# Patient Record
Sex: Female | Born: 1975
Health system: Southern US, Community
[De-identification: ages and names within clinical notes are randomized; demographics above are authoritative.]

## PROBLEM LIST (undated history)

## (undated) DIAGNOSIS — N83209 Unspecified ovarian cyst, unspecified side: Secondary | ICD-10-CM

## (undated) DIAGNOSIS — N809 Endometriosis, unspecified: Secondary | ICD-10-CM

## (undated) HISTORY — DX: Endometriosis, unspecified: N80.9

## (undated) HISTORY — PX: ABDOMINAL HYSTERECTOMY: SHX81

## (undated) HISTORY — PX: APPENDECTOMY: SHX54

## (undated) HISTORY — DX: Unspecified ovarian cyst, unspecified side: N83.209

---

## 2008-03-14 ENCOUNTER — Inpatient Hospital Stay (HOSPITAL_COMMUNITY): Admission: AD | Admit: 2008-03-14 | Discharge: 2008-03-18 | Payer: Self-pay | Admitting: Obstetrics and Gynecology

## 2008-03-14 ENCOUNTER — Encounter (INDEPENDENT_AMBULATORY_CARE_PROVIDER_SITE_OTHER): Payer: Self-pay | Admitting: Obstetrics and Gynecology

## 2008-04-04 ENCOUNTER — Ambulatory Visit (HOSPITAL_COMMUNITY): Admission: RE | Admit: 2008-04-04 | Discharge: 2008-04-04 | Payer: Self-pay | Admitting: Obstetrics and Gynecology

## 2009-06-28 IMAGING — CT CT ABDOMEN W/ CM
2 of 6 series · 17 of 46 positions shown, 19 images · IV contrast (agent unspecified)
Comparison: None

CT ABDOMEN

CLINICAL DATA: Right lower quadrant abdominal pain.  History of a
C-section 3 weeks ago.

CT ABDOMEN AND PELVIS WITH CONTRAST
TECHNIQUE: Multidetector CT imaging of the abdomen and pelvis was
performed using the standard protocol following bolus
administration of intravenous contrast.
Contrast: 100 ml of Qmnipaque-LUU

[Series 2: abd_pel 5.0 b40s · axial · 0.92mm/px · z∈[-486,-96]mm · 14 of 91 slices shown, 16 images]
[im 7/91  soft-tissue]
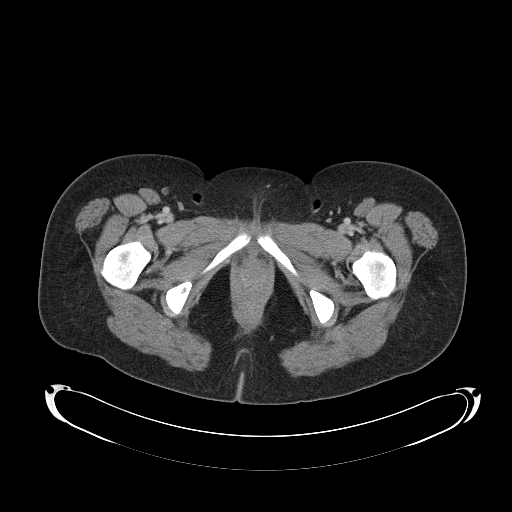
[im 7/91  bone]
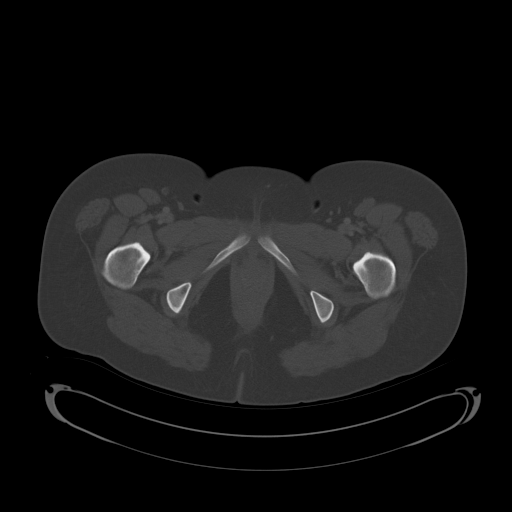
[im 13/91  soft-tissue]
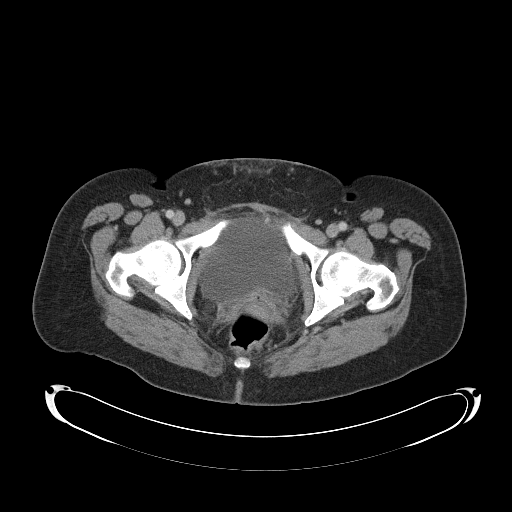
[im 19/91  soft-tissue]
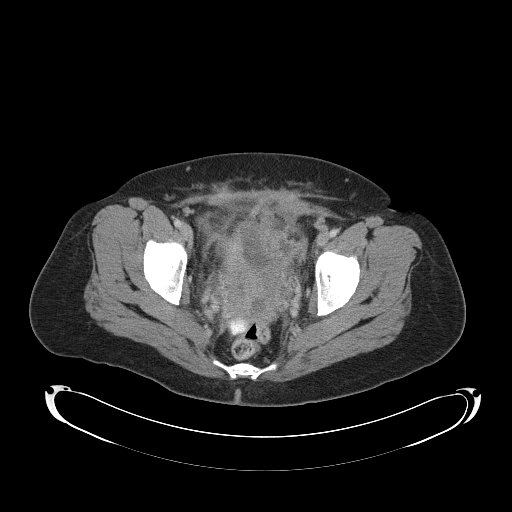
[im 25/91  soft-tissue]
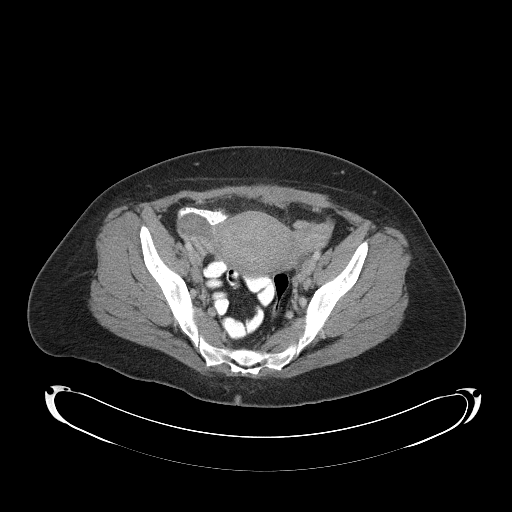
[im 31/91  soft-tissue]
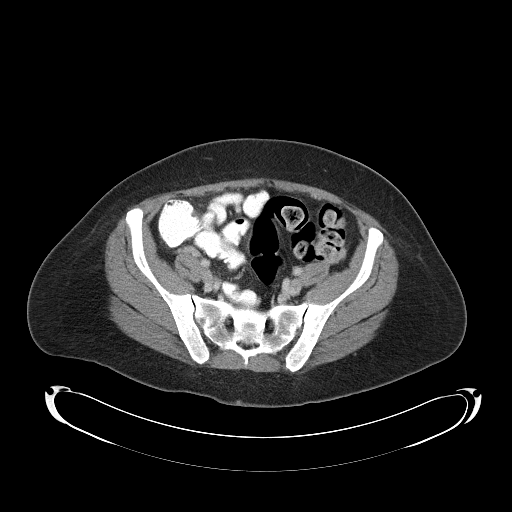
[im 37/91  soft-tissue]
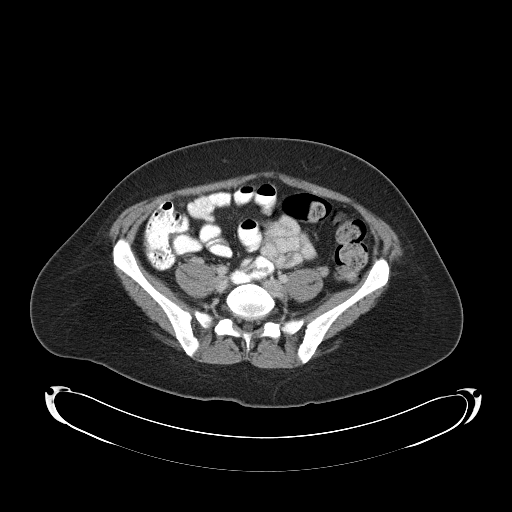
[im 43/91  soft-tissue]
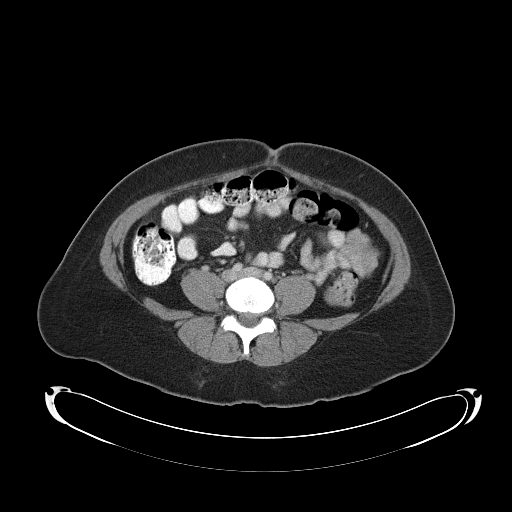
[im 49/91  soft-tissue]
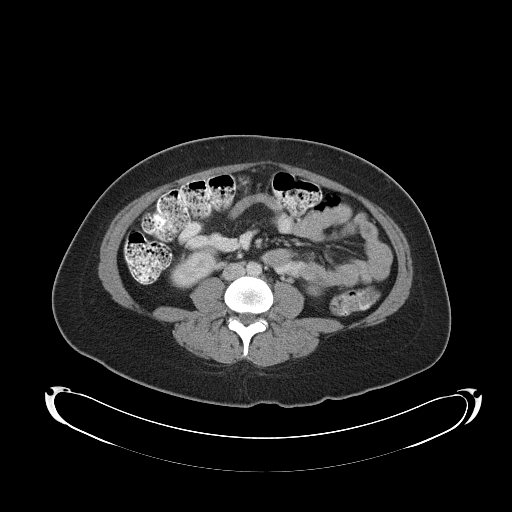
[im 55/91  soft-tissue]
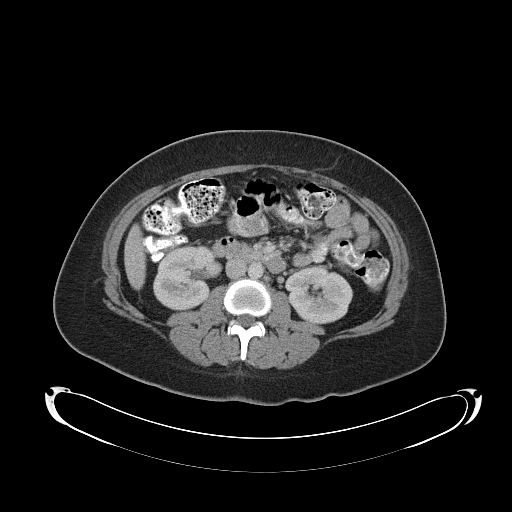
[im 55/91  bone]
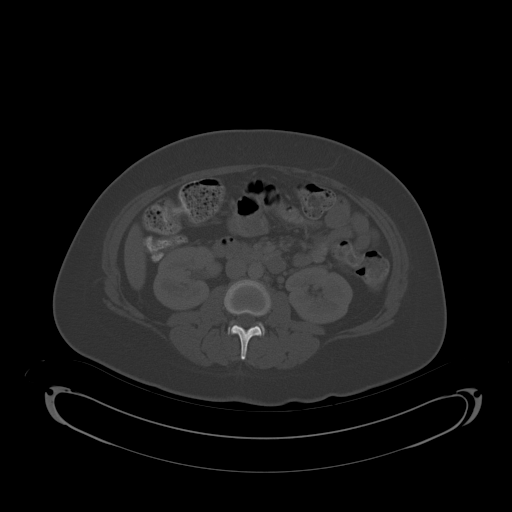
[im 61/91  soft-tissue]
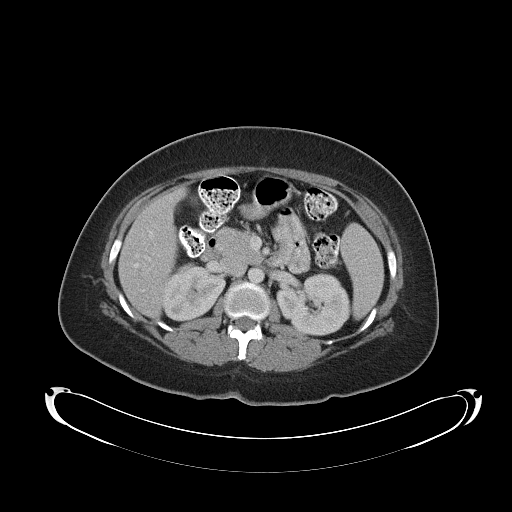
[im 67/91  soft-tissue]
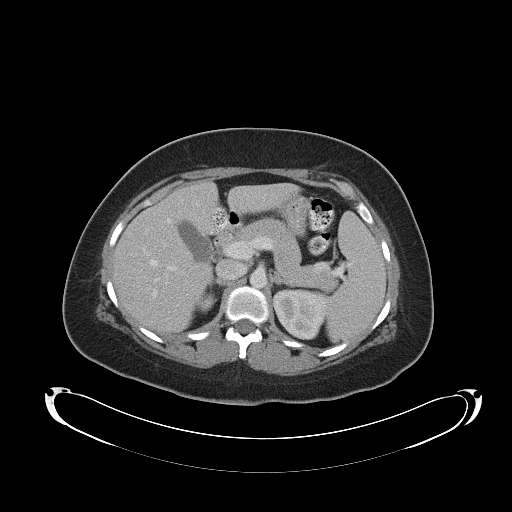
[im 73/91  soft-tissue]
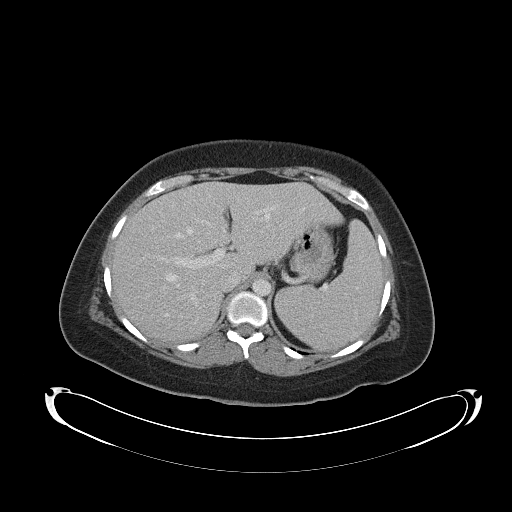
[im 79/91  soft-tissue]
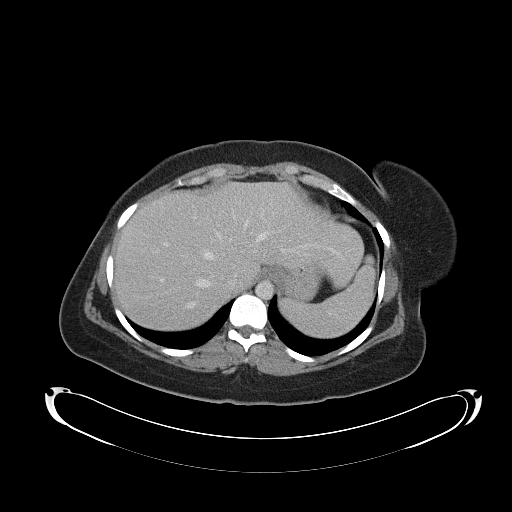
[im 85/91  soft-tissue]
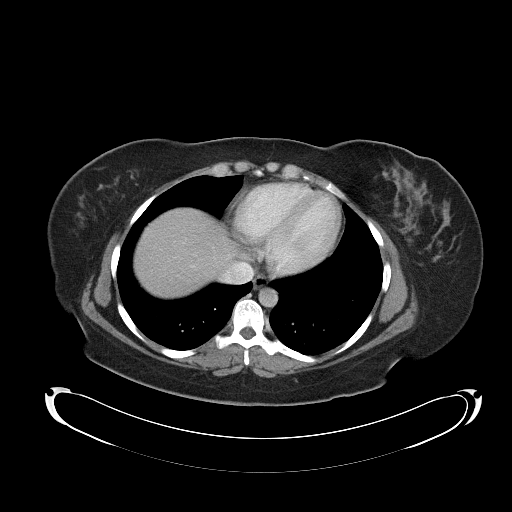

[Series 602: <mpr thick range> · coronal · 0.92mm/px · 3 of 89 slices shown]
[im 30/89  soft-tissue]
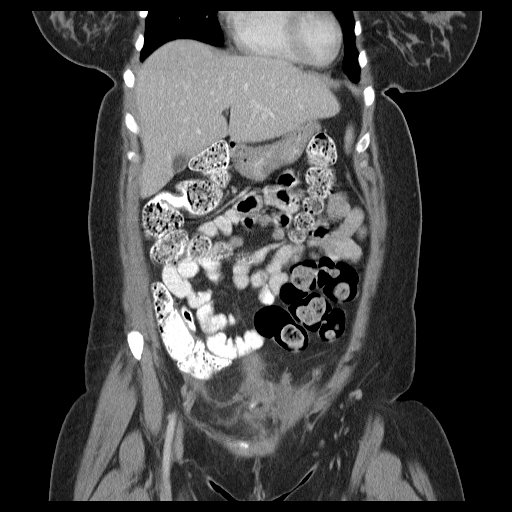
[im 40/89  soft-tissue]
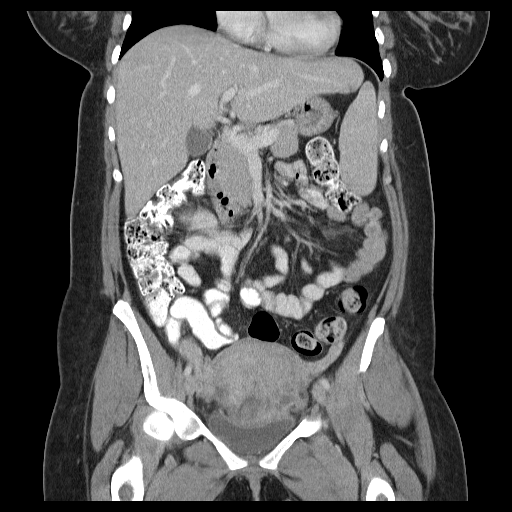
[im 49/89  soft-tissue]
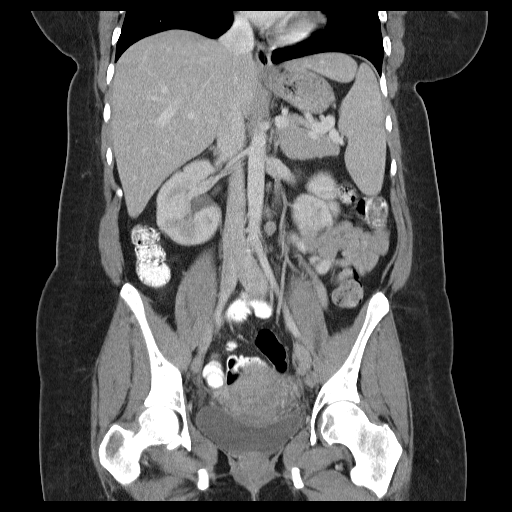

[17 of 46 positions shown; findings below may reference images not displayed]

FINDINGS: The lung bases are clear.  The liver, spleen, pancreas,
adrenal glands and kidneys are unremarkable.

The stomach, duodenum, small bowel and colon demonstrate no
significant findings.  There is moderate stool throughout the colon
which could suggest constipation.

The aorta is normal in caliber.  No dissection.  The major branch
vessels are normal.  No mesenteric or retroperitoneal masses or
adenopathy.

No significant bony findings.
IMPRESSION: 1.  Unremarkable CT examination of the abdomen.  No acute abdominal
findings, mass lesions or adenopathy.
2.  Moderate stool throughout the colon.

CT PELVIS
FINDINGS: The rectum, sigmoid colon visualized small bowel loops
are unremarkable.  The appendix is not visualized for certain but I
do not see a definite findings for appendicitis.  Both ovaries
appear normal.  The uterus is abnormal in appearance.  The anterior
uterine myometrium appears separated by fluid.  It may be a
dehiscent C-section wound.  Intervening hematoma would be possible.
There is also mild fluid and inflammatory change in the pelvis
which may be resolving postoperative hematoma.  The urinary bladder
appears normal.  Multiple Nabothian cysts are noted near the
cervix.
IMPRESSION: 1.  Postoperative changes in the pelvis from a recent C-section.
Findings suspicious for a dehiscent wound involving the anterior
uterus.
2.  The ovaries appear normal.

3.  Nabothian cysts noted at the cervix.

## 2010-07-25 ENCOUNTER — Other Ambulatory Visit (HOSPITAL_COMMUNITY): Payer: Self-pay | Admitting: Gastroenterology

## 2010-07-25 ENCOUNTER — Other Ambulatory Visit: Payer: Self-pay | Admitting: Gastroenterology

## 2010-07-25 DIAGNOSIS — R110 Nausea: Secondary | ICD-10-CM

## 2010-08-01 ENCOUNTER — Encounter (HOSPITAL_COMMUNITY)
Admission: RE | Admit: 2010-08-01 | Discharge: 2010-08-01 | Disposition: A | Discharge status: Home / Self Care | Payer: 59 | Source: Ambulatory Visit | Attending: Gastroenterology | Admitting: Gastroenterology

## 2010-08-01 DIAGNOSIS — R110 Nausea: Secondary | ICD-10-CM

## 2010-08-01 MED ORDER — TECHNETIUM TC 99M MEBROFENIN IV KIT
5.4000 | PACK | Freq: Once | INTRAVENOUS | Status: AC | PRN
  Administered 2010-08-01: 5 via INTRAVENOUS

## 2010-08-01 MED ORDER — SINCALIDE 5 MCG IJ SOLR: 0.0200 ug/kg | Freq: Once | INTRAMUSCULAR | Status: DC

## 2010-08-05 ENCOUNTER — Ambulatory Visit
Admission: RE | Admit: 2010-08-05 | Discharge: 2010-08-05 | Disposition: A | Discharge status: Home / Self Care | Payer: 59 | Source: Ambulatory Visit | Attending: Gastroenterology | Admitting: Gastroenterology

## 2010-08-05 DIAGNOSIS — R110 Nausea: Secondary | ICD-10-CM

## 2010-09-16 NOTE — H&P (Signed)
NAMESRI, CLEGG                 ACCOUNT NO.:  [id number]   MEDICAL RECORD NO.:  [id number]          PATIENT TYPE:  INP   LOCATION:  NA                            [facility]  [facility]   PHYSICIAN:  Sherron Monday, MD        DATE OF BIRTH:  [birth year]   DATE OF ADMISSION:  03/14/2008  DATE OF DISCHARGE:                              HISTORY & PHYSICAL   PROCEDURE PLANNED:  Repeat low transverse cesarean section, bilateral  tubal ligation.   HISTORY OF PRESENT ILLNESS:  A 35 year old, G4, P2-0-1-2, at 33 plus  weeks for repeat low transverse cesarean section, as well as bilateral  tubal ligation.  I discussed with the patient the risks, benefits and  alternatives of low transverse cesarean section and bilateral tubal  ligation including, but not limited to, bleeding, infection, damage to  surrounding organs, as well as risk of failure with a bilateral tubal  ligation.  She voices understanding to all this and wishes to proceed.  She will be scheduled for a cesarean section on the morning of March 14, 2008.   PAST MEDICAL HISTORY:  Migraines.  Otherwise, not significant.   SURGICAL HISTORY:  1. Two cesarean sections.  2. Wisdom tooth extraction.   PAST GYNECOLOGIC HISTORY:  1. G1 was a 36-week cesarean section with abruption and bleeding of a      6 pound, 7 ounce female infant.  2. G2 was a repeat low transverse cesarean section for frank breech at      39 weeks, a 6 pound, 6 ounce child.  3. G3 was a miscarriage.  4. G4 is the present pregnancy with uncomplicated prenatal care, with      a late transfer to care at 30 weeks to [place] from previous care in      [place], [place].  5. She has a history of an abnormal Pap with a distant colposcopy;      normal since.  No history of any sexually transmitted diseases.  6. She had a hemorrhage after her delivery in 2003 without a      transfusion.  7. She has had pregnancy-induced hypertension with her first      pregnancy, as  well.   MEDICATIONS:  Prenatal vitamins.   ALLERGIES:  LATEX.   SOCIAL HISTORY:  The patient denies alcohol, tobacco or drug use.  She  is married.   FAMILY HISTORY:  Significant for heart disease in maternal grandfather.  Hypertension in mother and father.  Father with prostate cancer.  Paternal grandfather with prostate cancer.  Sister with depression.   PHYSICAL EXAMINATION:  VITAL SIGNS:  She is afebrile with stable vital  signs.  GENERAL:  No apparent distress.  CARDIOVASCULAR:  Regular rate and rhythm.  LUNGS:  Clear to auscultation bilaterally.  ABDOMEN:  Soft.  Fundus is nontender.  EXTremities: symmetric, nontender.  Fetal heart tones in the office were 120s, and her cervical exam was 1  cm dilated 50% effaced and -3 station.   This is a 35 year old, G4, P2-0-1-2, at 4 plus weeks for repeat low  transverse cesarean section.  Will proceed with this on the morning of  March 14, 2008.  We discussed risks, benefits and alternatives with  her and her husband, and they wish to proceed with this, as well as a  bilateral tubal ligation.  She voiced understanding of all this and  wishes to proceed.      Sherron Monday, MD  Electronically Signed     JB/MEDQ  D:  03/13/2008  T:  03/13/2008  Job:  [id number]

## 2010-09-16 NOTE — Discharge Summary (Signed)
NAMECALIN, Bailey [name]                 ACCOUNT NO.:  [id number]   MEDICAL RECORD NO.:  [id number]          PATIENT TYPE:  INP   LOCATION:  9139                          [facility]ILITY:  [facility]   PHYSICIAN:  Zenaida Niece, M.D.DATE OF BIRTH:  Sep 05, 1975   DATE OF ADMISSION:  03/14/2008  DATE OF DISCHARGE:  03/18/2008                               DISCHARGE SUMMARY   ADMISSION DIAGNOSES:  1. Intrauterine pregnancy at 39 weeks.  2. Previous cesarean section.  3. Desires surgical sterility.   DISCHARGE DIAGNOSES:  1. Intrauterine pregnancy at 39 weeks.  2. Previous cesarean section.  3. Postpartum anemia.  4. Desires surgical sterility.   PROCEDURE:  Repeat low transverse cesarean section and transfusion of 3  units of packed red blood cells.   HISTORY AND PHYSICAL:  Please see chart for full history and physical.  Briefly, this is a 35 year old gravida 4, para 2-0-1-2 with an EGA of [redacted]  weeks who presents for repeat low transverse cesarean section and tubal  ligation.  Prenatal care has been essentially uncomplicated.   PAST OB HISTORY:  Two cesarean sections and one miscarriage.   Physical exam is significant for a gravid abdomen.  Cervix is 1 cm  dilated.   HOSPITAL COURSE:  The patient was admitted on the day of surgery and  underwent repeat low transverse cesarean section by Dr. Ellyn Hack.  She  delivered a viable female infant with Apgars of 9 and 9, weight 6 pounds  and 13 ounces.  There were noted to be a extensive abdominal adhesions.  Dr. Ellyn Hack writes an estimated blood loss of 800 mL.  Due to the  adhesions, tubal ligation was unable to be performed.  On the evening of  surgery, Dr. Ambrose Mantle saw the patient and noted that her pulse was 130-160  with decreased urine output.  Blood pressure was stable.  Uterus was  below the umbilicus and abdomen was soft.  He did a pelvic exam and  evacuated approximately 150 mL of blood clot from the uterus.  He writes  approximately 400 mL of  total blood including clots and the uterus felt  firm.  Hemoglobin preop was 11.4.  Dr. Ambrose Mantle had a hemoglobin drawn and  it was 6.9.  She was given IV fluids.  She was given Methergine and  Hemabate for apparent uterine atony.  Hemoglobin on the morning of  March 15, 2008, was down to 5.3 and repeat that afternoon was 5.0.  At this point, the patient declined transfusion.  On the morning of  March 16, 2008, she remained hemodynamically stable, but hemoglobin  was down to 4.7.  Risks, benefits, and alternatives were discussed by  Dr. Ellyn Hack and she agreed to the transfusion of 3 units of packed red  blood cells.  On March 16, 2008, she received 3 units of packed red  blood cells.  She remained afebrile and had no other problems.  Hemoglobin on the morning of postoperative #3 after the transfusion was  7.8.  She was stable.  She was kept for further observation for any  significant bleeding.  On the morning of March 18, 2008, which was  postoperative #4, she remained afebrile without problems.  Hemoglobin  was stable at 8.1.  At this point, she was felt to be stable enough for  discharge home.  Her incision was healing well with a subcuticular  suture.   DISCHARGE INSTRUCTIONS:  Regular diet, pelvic rest, and follow up in 2  weeks.  Medications are over-the-counter Tylenol as needed and she was  given our discharge pamphlet.      Zenaida Niece, M.D.  Electronically Signed     TDM/MEDQ  D:  03/18/2008  T:  03/18/2008  Job:  [id number]

## 2010-09-16 NOTE — Op Note (Signed)
Bailey [name], Bailey [name]                 ACCOUNT NO.:  [id number]   MEDICAL RECORD NO.:  [id number]          PATIENT TYPE:  INP   LOCATION:  9139                          [facility]ILITY[facility]   PHYSICIAN:  Sherron Monday, MD        DATE OF BIRTH:  [birth year]   DATE OF PROCEDURE:  03/14/2008  DATE OF DISCHARGE:                               OPERATIVE REPORT   PREOPERATIVE DIAGNOSES:  1. Intrauterine pregnancy at term.  2. History of low transverse cesarean section x2.  3. Undesired fertility.   POSTOPERATIVE DIAGNOSES:  1. Intrauterine pregnancy at term.  2. History of low transverse cesarean section x2.  3. Delivered.   PROCEDURES:  1. Repeat low transverse cesarean section.  2. Lysis of adhesions.   SURGEON:  Sherron Monday, MD   ASSISTANT:  Zenaida Niece, MD   ANESTHESIA:  Spinal.   FINDINGS:  Viable female infant at 10:21 a.m. with Apgars of 9 at 1-minute  and 9 at 5 minutes and a weight of 6 pounds 13 ounces as well as  extensive uterine abdominal wall adhesions, omental peritoneal  adhesions, unable to visualize tubes or ovaries secondary to the  adhesions.  However, they all felt normal per the exam.   SPECIMEN:  Placenta to Pathology.   ESTIMATED BLOOD LOSS:  800 mL.   URINE OUTPUT:  100 mL of clear urine at the end of the procedure.   IV FLUIDS:  2200 mL.   COMPLICATIONS:  Unable to perform bilateral tubal ligation secondary to  adhesions and anterior uterine wall laceration.   DISPOSITION:  Stable to PACU.   PROCEDURE:  After informed consent, it was reviewed with the patient  including risks, benefits, and alternatives of the surgical procedure.  She was transferred to the OR where spinal anesthesia was induced and  found to be adequate.  She was then placed in supine position with a  leftward tilt.  She was prepped and draped in the normal sterile  fashion.  A Pfannenstiel skin incision was made at the level of her  previous Pfannenstiel, carried through the  underlying layer of fascia  sharply.  The fascia was incised in midline.  The incision was extended  laterally with Mayo scissors.  Midline was easily identified, elevated  with snaps, and the peritoneum was entered sharply.  The incision was  extended superiorly and inferiorly with good visualization of the  bladder, and entering the peritoneum, it was noted that the anterior  abdominal wall was closely approximated, and the rectus muscles were  scarred to the anterior abdominal wall.  An inadvertent uterine  laceration was made.  The uterus was incised in the transverse fashion.  Infant was delivered from the vertex presentation.  The nose and mouth  were suctioned on the field.  Cord was clamped and cut.  Infant was  handed off to awaiting pediatric staff.  The placenta was then  expressed.  The uterus was cleared of all clots and debris.  The uterine  incision was closed in 2 layers of 0 Monocryl.  The first of which  was a  running locked and the second was an imbricating on the right aspect of  the incision.  The rectus muscle was scarred to the uterus.  This was  taken down with cautery and careful visualization.  The irrigation of  the pelvis was attempted to be performed.  Then, the rectus muscles were  reapproximated using 2-0 Vicryl.  The subfascial planes were inspected  and found to be hemostatic.  The rectus muscles were also hemostatic.  The fascia was closed with 0 Vicryl in a running fashion.  The  subcuticular adipose layer was reapproximated using 2-0 plain gut.  The  skin at the patient's request was closed with subcuticular with a 3-0  Vicryl in a subcuticular fashion.  Benzoin and Steri-Strips were  applied.  Sponge, lap, and needle counts were correct x2 at the end of  the procedure.  The patient tolerated the procedure well and transported  to the PACU in stable condition.   Secondary to being unable to visualize tubes and ovaries, I discussed  with the patient.   We will perform a postpartum Essure procedure for  tubal ligation versus using an IUD at the patient's request.  We will  discuss this further and she voiced understanding all of these.      Sherron Monday, MD  Electronically Signed     JB/MEDQ  D:  03/14/2008  T:  03/14/2008  Job:  [id number]

## 2011-02-03 LAB — CBC
HCT: 34.7 — ABNORMAL LOW
Hemoglobin: 11.4 — ABNORMAL LOW
Hemoglobin: 6.9 — CL
MCHC: 32.9
MCHC: 33
MCHC: 33.4
MCV: 84.8
MCV: 85.4
MCV: 85.4
Platelets: 105 — ABNORMAL LOW
RBC: 2.49 — ABNORMAL LOW
RBC: 4.06
RDW: 15.3
RDW: 15.8 — ABNORMAL HIGH
WBC: 6.3

## 2011-02-03 LAB — TYPE AND SCREEN: ABO/RH(D): A POS

## 2011-02-03 LAB — ABO/RH: ABO/RH(D): A POS

## 2011-02-03 LAB — HEMATOCRIT: HCT: 14.1 — ABNORMAL LOW

## 2011-02-03 LAB — HEMOGLOBIN: Hemoglobin: 5 — CL

## 2011-06-03 ENCOUNTER — Ambulatory Visit: Payer: Self-pay | Admitting: Obstetrics & Gynecology

## 2011-06-03 LAB — CBC
MCH: 29.2 pg (ref 26.0–34.0)
MCHC: 34.8 g/dL (ref 32.0–36.0)
MCV: 84 fL (ref 80–100)
Platelet: 173 10*3/uL (ref 150–440)
RBC: 4.07 10*6/uL (ref 3.80–5.20)

## 2011-06-03 LAB — PREGNANCY, URINE: Pregnancy Test, Urine: NEGATIVE m[IU]/mL

## 2011-06-09 ENCOUNTER — Observation Stay: Payer: Self-pay | Admitting: Obstetrics & Gynecology

## 2011-12-22 ENCOUNTER — Ambulatory Visit: Payer: Self-pay | Admitting: Family Medicine

## 2014-08-26 NOTE — Op Note (Signed)
PATIENT NAME:  Bailey [name], Bailey [name] MR#:  [id number] DATE OF BIRTH:  1976-02-17  DATE OF PROCEDURE:  06/09/2011  PREOPERATIVE DIAGNOSIS: Menorrhagia.  POSTOPERATIVE DIAGNOSIS: Menorrhagia.   PROCEDURE: Laparoscopic supracervical hysterectomy with bilateral salpingectomy.   SURGEON: Dierdre Searles. Paul Radin Raptis, MD   ASSISTANT: Adria Devonarrie Klett, MD   ANESTHESIA: General.  ESTIMATED BLOOD LOSS: 25 mL.   COMPLICATIONS: None.   FINDINGS: Significant adhesions of the uterus to the anterior abdominal wall.   DISPOSITION: To recovery room in stable condition.   TECHNIQUE: The patient is prepped and draped in the usual sterile fashion after adequate anesthesia is obtained in the dorsal lithotomy position. Foley catheter is inserted. Sponge stick is placed for vaginal manipulation.   Attention is then turned to the abdomen where a Veress needle is inserted through a 5 mm infraumbilical incision after Marcaine is used to anesthetize the skin. Veress needle placement is confirmed using the hanging drop technique and the abdomen is then insufflated with CO2 gas. A 5 mm trocar is then inserted under direct visualization with the laparoscope with no injuries or bleeding noted.   The patient is placed in Trendelenburg positioning and an 11 mm trocar is placed in the right lower quadrant and a 5 mm trocar is placed in the left lower quadrant both lateral to the inferior epigastric blood vessels with no injuries or bleeding noted. The adhesions are noted including some omental adhesions but mostly adhesions of the uterus to the anterior abdominal wall in the lower uterine segment area and bladder area The adhesions are carefully dissected down to this level with further dissection throughout the case as we worked our way towards the lower uterine segment and cervix. The round ligaments are carefully coagulated and cut. The fallopian tubes are grasped and excised with preservation of the main blood supply to the ovaries and the  ovaries themselves. The uterine ovarian blood vessels and ligaments are carefully coagulated and but using the Harmonic scalpel and the dissection was carried down to the level of the uterine arteries which are coagulated using the bipolar cautery device. Further dissection of the bladder in an inferior direction is performed. The bladder is retrograde filled with methylene blue solution to ensure the integrity and to identify the boundaries of the bladder. Using the Harmonic scalpel, the uterus is amputated with a portion of the cervix left behind to complete the supracervical hysterectomy component of the case. Approximately 1 to 1.5 cm length of cervix is left in utero. The endocervical canal is cauterized. The sponge stick is maneuvered to visualize the integrity of the cervix and the bladder with no injuries apparently noted. There is no apparent injury to bowel or ureter as well.   Morcellator device is placed through the right lower quadrant incision and the uterus is removed by morcellation process. The pelvic cavity is irrigated with aspiration of fluid and hemostasis is assured using electrocautery. Interceed is placed over the cervical stump to minimize future adhesion formation. The right lower quadrant incision is closed with a Vicryl suture using a fascial closure device. All trocars are removed after gas is expelled and the patient is leveled. Skin is closed with Dermabond. Foley catheter left in place and sponge stick is removed. The patient goes to the recovery room in stable condition. All sponge, instrument, and needle counts are correct.   ____________________________ [name]. Annamarie MajorPaul Jonita Hirota, MD rph:drc D: 06/09/2011 08:54:37 ET T: 06/09/2011 10:18:06 ET JOB#: [id number]  cc: Dierdre Searles. Paul Pilot Prindle, MD, <Dictator> Nadara MustardOBERT P Jere Vanburen MD  ELECTRONICALLY SIGNED 06/10/2011 7:49

## 2017-06-04 ENCOUNTER — Encounter: Payer: Self-pay | Admitting: Certified Nurse Midwife

## 2017-06-08 ENCOUNTER — Encounter: Payer: Self-pay | Admitting: Obstetrics and Gynecology

## 2017-06-28 ENCOUNTER — Encounter: Payer: Self-pay | Admitting: Obstetrics and Gynecology

## 2017-06-28 ENCOUNTER — Ambulatory Visit: Payer: Managed Care, Other (non HMO) | Admitting: Obstetrics and Gynecology

## 2017-06-28 ENCOUNTER — Other Ambulatory Visit: Payer: Self-pay | Admitting: Obstetrics and Gynecology

## 2017-06-28 VITALS — BP 132/90 | HR 81 | Ht 60.0 in | Wt 184.5 lb

## 2017-06-28 DIAGNOSIS — R102 Pelvic and perineal pain: Secondary | ICD-10-CM

## 2017-06-28 DIAGNOSIS — R300 Dysuria: Secondary | ICD-10-CM | POA: Diagnosis not present

## 2017-06-28 LAB — POCT URINALYSIS DIPSTICK
Bilirubin, UA: NEGATIVE
Blood, UA: NEGATIVE
Glucose, UA: NEGATIVE
Ketones, UA: NEGATIVE
LEUKOCYTES UA: NEGATIVE
NITRITE UA: NEGATIVE
PH UA: 6 (ref 5.0–8.0)
PROTEIN UA: NEGATIVE
Spec Grav, UA: 1.015 (ref 1.010–1.025)
UROBILINOGEN UA: 0.2 U/dL

## 2017-06-28 NOTE — Progress Notes (Signed)
HPI:      [name] is a 42 y.o. G3P3 who LMP was Patient's last menstrual period was 07/25/2010.  Subjective:   She presents today with complaint of intermittent pelvic pressure symptoms.  These symptoms last for short periods of time and are not particularly associated with bowel movements urination etc.  She says that she feels like sometimes things are falling out.  She reports no urine loss.  She reports no problems with intercourse. Patient has history of endometriosis discovered at the time of supracervical hysterectomy. Patient has had a supracervical hysterectomy. Multiple prior cesarean deliveries. Patient is delinquent in her annual examinations.    Hx: The following portions of the patient's history were reviewed and updated as appropriate:             She  has a past medical history of Endometriosis and Ovarian cyst. She does not have a problem list on file. She  has a past surgical history that includes Abdominal hysterectomy; Appendectomy; and Cesarean section. Her family history includes Cancer in her father and paternal aunt. She  reports that  has never smoked. she has never used smokeless tobacco. She reports that she does not drink alcohol or use drugs. She currently has no medications in their medication list. She has No Known Allergies.       Review of Systems:  Review of Systems  Constitutional: Denied constitutional symptoms, night sweats, recent illness, fatigue, fever, insomnia and weight loss.  Eyes: Denied eye symptoms, eye pain, photophobia, vision change and visual disturbance.  Ears/Nose/Throat/Neck: Denied ear, nose, throat or neck symptoms, hearing loss, nasal discharge, sinus congestion and sore throat.  Cardiovascular: Denied cardiovascular symptoms, arrhythmia, chest pain/pressure, edema, exercise intolerance, orthopnea and palpitations.  Respiratory: Denied pulmonary symptoms, asthma, pleuritic pain, productive sputum, cough, dyspnea and  wheezing.  Gastrointestinal: Denied, gastro-esophageal reflux, melena, nausea and vomiting.  Genitourinary: See HPI for additional information.  Musculoskeletal: Denied musculoskeletal symptoms, stiffness, swelling, muscle weakness and myalgia.  Dermatologic: Denied dermatology symptoms, rash and scar.  Neurologic: Denied neurology symptoms, dizziness, headache, neck pain and syncope.  Psychiatric: Denied psychiatric symptoms, anxiety and depression.  Endocrine: Denied endocrine symptoms including hot flashes and night sweats.   Meds:   No current outpatient medications on file prior to visit.   No current facility-administered medications on file prior to visit.     Objective:     Vitals:   06/28/17 0911  BP: 132/90  Pulse: 81              Physical examination   Pelvic:   Vulva: Normal appearance.  No lesions.  Vagina: No lesions or abnormalities noted.  Support: Normal pelvic support.  Urethra No masses tenderness or scarring.  Meatus Normal size without lesions or prolapse.  Cervix: Normal appearance.  No lesions.  Anus: Normal exam.  No lesions.  Perineum: Normal exam.  No lesions.        Bimanual   Uterus:  Not palpated  Adnexae: No masses.  Non-tender to palpation.  Cul-de-sac: Negative for abnormality.     Assessment:    G3P3 There are no active problems to display for this patient.    1. Dysuria   2. Pelvic pain in female     No evidence of pelvic prolapse.  Excellent support.  No nodularity consistent with endometriosis.  No pain on exam.   Plan:            1.  Ultrasound attention ovaries.  2.  Consider trial of OCPs. Orders Orders Placed This Encounter  Procedures  . POCT urinalysis dipstick    No orders of the defined types were placed in this encounter.     F/U  Return in about 1 week (around 07/05/2017).  Bailey [name], M.D. 06/28/2017 9:49 AM

## 2017-06-30 ENCOUNTER — Encounter: Payer: Self-pay | Admitting: Obstetrics and Gynecology

## 2017-07-06 ENCOUNTER — Ambulatory Visit (INDEPENDENT_AMBULATORY_CARE_PROVIDER_SITE_OTHER): Payer: Managed Care, Other (non HMO)

## 2017-07-06 ENCOUNTER — Ambulatory Visit (INDEPENDENT_AMBULATORY_CARE_PROVIDER_SITE_OTHER): Payer: Managed Care, Other (non HMO) | Admitting: Obstetrics and Gynecology

## 2017-07-06 ENCOUNTER — Encounter: Payer: Self-pay | Admitting: Obstetrics and Gynecology

## 2017-07-06 VITALS — BP 130/82 | HR 71 | Ht 60.0 in | Wt 184.4 lb

## 2017-07-06 DIAGNOSIS — R102 Pelvic and perineal pain: Secondary | ICD-10-CM

## 2017-07-06 NOTE — Progress Notes (Signed)
HPI:      Bailey [name] is a 42 y.o. G3P3 who LMP was Patient's last menstrual period was 07/25/2010.  Subjective:   She presents today after having her pelvic ultrasound for pelvic pressure symptoms.  She continues to experience some pelvic pressure symptoms.  It is not severe more of an annoying issue.    Hx: The following portions of the patient's history were reviewed and updated as appropriate:             She  has a past medical history of Endometriosis and Ovarian cyst. She does not have a problem list on file. She  has a past surgical history that includes Abdominal hysterectomy; Appendectomy; and Cesarean section. Her family history includes Cancer in her father and paternal aunt. She  reports that  has never smoked. she has never used smokeless tobacco. She reports that she does not drink alcohol or use drugs. She currently has no medications in their medication list. She has No Known Allergies.       Review of Systems:  Review of Systems  Constitutional: Denied constitutional symptoms, night sweats, recent illness, fatigue, fever, insomnia and weight loss.  Eyes: Denied eye symptoms, eye pain, photophobia, vision change and visual disturbance.  Ears/Nose/Throat/Neck: Denied ear, nose, throat or neck symptoms, hearing loss, nasal discharge, sinus congestion and sore throat.  Cardiovascular: Denied cardiovascular symptoms, arrhythmia, chest pain/pressure, edema, exercise intolerance, orthopnea and palpitations.  Respiratory: Denied pulmonary symptoms, asthma, pleuritic pain, productive sputum, cough, dyspnea and wheezing.  Gastrointestinal: Denied, gastro-esophageal reflux, melena, nausea and vomiting.  Genitourinary: See HPI for additional information.  Musculoskeletal: Denied musculoskeletal symptoms, stiffness, swelling, muscle weakness and myalgia.  Dermatologic: Denied dermatology symptoms, rash and scar.  Neurologic: Denied neurology symptoms, dizziness, headache, neck  pain and syncope.  Psychiatric: Denied psychiatric symptoms, anxiety and depression.  Endocrine: Denied endocrine symptoms including hot flashes and night sweats.   Meds:   No current outpatient medications on file prior to visit.   No current facility-administered medications on file prior to visit.     Objective:     Vitals:   07/06/17 0925  BP: 130/82  Pulse: 71              Ultrasound results reviewed directly with the patient.  Ovaries not visualized but no evidence of ovarian cyst/mass or adnexal mass. Significant bowel gas present.  Assessment:    G3P3 There are no active problems to display for this patient.    1. Pelvic pain in female     Possibly caused by increased bowel discomfort pressure as noted by significant bowel gas pattern on ultrasound.   Plan:            1.  Discussed use of simethicone as well as increased fiber both artificial and dietary.  2.  Patient feels reassured.   3.  Will return for annual exam. Orders No orders of the defined types were placed in this encounter.   No orders of the defined types were placed in this encounter.     F/U  Return in about 2 weeks (around 07/20/2017) for As scheduled.  Elonda Huskyavid J. Evans, M.D. 07/06/2017 9:55 AM

## 2017-07-21 ENCOUNTER — Encounter: Payer: Managed Care, Other (non HMO) | Admitting: Obstetrics and Gynecology

## 2017-07-23 ENCOUNTER — Ambulatory Visit (INDEPENDENT_AMBULATORY_CARE_PROVIDER_SITE_OTHER): Payer: Managed Care, Other (non HMO) | Admitting: Obstetrics and Gynecology

## 2017-07-23 ENCOUNTER — Encounter: Payer: Self-pay | Admitting: Obstetrics and Gynecology

## 2017-07-23 VITALS — BP 127/92 | HR 85 | Ht 60.0 in | Wt 180.3 lb

## 2017-07-23 DIAGNOSIS — Z0000 Encounter for general adult medical examination without abnormal findings: Secondary | ICD-10-CM

## 2017-07-23 NOTE — Progress Notes (Signed)
Pt is doing well.

## 2017-07-23 NOTE — Progress Notes (Signed)
HPI:      [name] is a 42 y.o. G3P3 who LMP was Patient's last menstrual period was 07/25/2010.  Subjective:   She presents today for her annual examination.  She has tried simethicone for bowel gas but has name no difference in her pelvic pressure symptoms.  Otherwise she has no complaints. We have discussed her family history and there is significant family history of cancer on her father's side.  Bailey [name] has never had a mammogram.  Has not previously consider genetic screening.    Hx: The following portions of the patient's history were reviewed and updated as appropriate:             She  has a past medical history of Endometriosis and Ovarian cyst. She does not have a problem list on file. She  has a past surgical history that includes Abdominal hysterectomy; Appendectomy; and Cesarean section. Her family history includes Cancer in her father and paternal aunt. She  reports that she has never smoked. She has never used smokeless tobacco. She reports that she does not drink alcohol or use drugs. She currently has no medications in their medication list. She has No Known Allergies.       Review of Systems:  Review of Systems  Constitutional: Denied constitutional symptoms, night sweats, recent illness, fatigue, fever, insomnia and weight loss.  Eyes: Denied eye symptoms, eye pain, photophobia, vision change and visual disturbance.  Ears/Nose/Throat/Neck: Denied ear, nose, throat or neck symptoms, hearing loss, nasal discharge, sinus congestion and sore throat.  Cardiovascular: Denied cardiovascular symptoms, arrhythmia, chest pain/pressure, edema, exercise intolerance, orthopnea and palpitations.  Respiratory: Denied pulmonary symptoms, asthma, pleuritic pain, productive sputum, cough, dyspnea and wheezing.  Gastrointestinal: Denied, gastro-esophageal reflux, melena, nausea and vomiting.  Genitourinary: Denied genitourinary symptoms including symptomatic vaginal discharge, pelvic  relaxation issues, and urinary complaints.  Musculoskeletal: Denied musculoskeletal symptoms, stiffness, swelling, muscle weakness and myalgia.  Dermatologic: Denied dermatology symptoms, rash and scar.  Neurologic: Denied neurology symptoms, dizziness, headache, neck pain and syncope.  Psychiatric: Denied psychiatric symptoms, anxiety and depression.  Endocrine: Denied endocrine symptoms including hot flashes and night sweats.   Meds:   No current outpatient medications on file prior to visit.   No current facility-administered medications on file prior to visit.     Objective:     Vitals:   07/23/17 0820  BP: (!) 127/92  Pulse: 85              Physical examination General NAD, Conversant  HEENT Atraumatic; Op clear with mmm.  Normo-cephalic. Pupils reactive. Anicteric sclerae  Thyroid/Neck Smooth without nodularity or enlargement. Normal ROM.  Neck Supple.  Skin No rashes, lesions or ulceration. Normal palpated skin turgor. No nodularity.  Breasts: No masses or discharge.  Symmetric.  No axillary adenopathy.  Lungs: Clear to auscultation.No rales or wheezes. Normal Respiratory effort, no retractions.  Heart: NSR.  No murmurs or rubs appreciated. No periferal edema  Abdomen: Soft.  Non-tender.  No masses.  No HSM. No hernia  Extremities: Moves all appropriately.  Normal ROM for age. No lymphadenopathy.  Neuro: Oriented to PPT.  Normal mood. Normal affect.     Pelvic:   Vulva: Normal appearance.  No lesions.  Vagina: No lesions or abnormalities noted.  Support: Normal pelvic support.  Urethra No masses tenderness or scarring.  Meatus Normal size without lesions or prolapse.  Cervix: Normal appearance.  No lesions.  Anus: Normal exam.  No lesions.  Perineum: Normal exam.  No lesions.        Bimanual   Uterus: Normal size.  Non-tender.  Mobile.  AV.  Adnexae: No masses.  Non-tender to palpation.  Cul-de-sac: Negative for abnormality.      Assessment:    G3P3 There  are no active problems to display for this patient.    1. Encounter for annual physical exam     Patient doing generally well.  She does have a family history of cancer.   Plan:            1.  Basic Screening Recommendations The basic screening recommendations for asymptomatic women were discussed with the patient during her visit.  The age-appropriate recommendations were discussed with her and the rational for the tests reviewed.  When I am informed by the patient that another primary care physician has previously obtained the age-appropriate tests and they are up-to-date, only outstanding tests are ordered and referrals given as necessary.  Abnormal results of tests will be discussed with her when all of her results are completed.  Mammogram ordered TSH, cholesterol, lipids, fasting glucose. 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 Orders No orders of the defined types were placed in this encounter.   No orders of the defined types were placed in this encounter.       F/U  Return in about 1 year (around 07/24/2018) for Annual Physical.  Elonda Huskyavid J. Kiano Terrien, M.D. 07/23/2017 8:49 AM

## 2017-07-24 LAB — TSH: TSH: 0.976 u[IU]/mL (ref 0.450–4.500)

## 2017-07-24 LAB — CHOLESTEROL, TOTAL: Cholesterol, Total: 169 mg/dL (ref 100–199)

## 2017-07-24 LAB — GLUCOSE, RANDOM: Glucose: 95 mg/dL (ref 65–99)

## 2017-07-27 LAB — IGP, COBASHPV16/18
HPV 16: NEGATIVE
HPV 18: NEGATIVE
HPV other hr types: NEGATIVE
PAP Smear Comment: 0

## 2017-07-28 ENCOUNTER — Telehealth: Payer: Self-pay

## 2017-07-28 NOTE — Telephone Encounter (Signed)
Message left on voicemail- neg/normal results per provider.

## 2017-10-05 ENCOUNTER — Encounter: Payer: Self-pay | Admitting: Obstetrics and Gynecology

## 2017-10-05 ENCOUNTER — Ambulatory Visit: Payer: Managed Care, Other (non HMO) | Admitting: Obstetrics and Gynecology

## 2017-10-05 VITALS — BP 133/84 | HR 76 | Ht 60.0 in | Wt 180.7 lb

## 2017-10-05 DIAGNOSIS — B373 Candidiasis of vulva and vagina: Secondary | ICD-10-CM

## 2017-10-05 DIAGNOSIS — B3731 Acute candidiasis of vulva and vagina: Secondary | ICD-10-CM

## 2017-10-05 DIAGNOSIS — N9089 Other specified noninflammatory disorders of vulva and perineum: Secondary | ICD-10-CM | POA: Diagnosis not present

## 2017-10-05 MED ORDER — FLUCONAZOLE 150 MG PO TABS: 150.0000 mg | ORAL_TABLET | ORAL | 0 refills | Status: DC

## 2017-10-05 NOTE — Progress Notes (Signed)
HPI:      [name] is a 42 y.o. G3P3 who LMP was Patient's last menstrual period was 07/25/2010.  Subjective:   She presents today with complaint of 1 week of vulvar irritation itching and burning.  She reports no vaginal discharge.  She has tried Monistat 3 without success.  She does report a new sexual contact and she has some concerns.    Hx: The following portions of the patient's history were reviewed and updated as appropriate:             She  has a past medical history of Endometriosis and Ovarian cyst. She does not have a problem list on file. She  has a past surgical history that includes Abdominal hysterectomy; Appendectomy; and Cesarean section. Her family history includes Cancer in her father and paternal aunt. She  reports that she has never smoked. She has never used smokeless tobacco. She reports that she does not drink alcohol or use drugs. She has a current medication list which includes the following prescription(s): fluconazole. She has No Known Allergies.       Review of Systems:  Review of Systems  Constitutional: Denied constitutional symptoms, night sweats, recent illness, fatigue, fever, insomnia and weight loss.  Eyes: Denied eye symptoms, eye pain, photophobia, vision change and visual disturbance.  Ears/Nose/Throat/Neck: Denied ear, nose, throat or neck symptoms, hearing loss, nasal discharge, sinus congestion and sore throat.  Cardiovascular: Denied cardiovascular symptoms, arrhythmia, chest pain/pressure, edema, exercise intolerance, orthopnea and palpitations.  Respiratory: Denied pulmonary symptoms, asthma, pleuritic pain, productive sputum, cough, dyspnea and wheezing.  Gastrointestinal: Denied, gastro-esophageal reflux, melena, nausea and vomiting.  Genitourinary: See HPI for additional information.  Musculoskeletal: Denied musculoskeletal symptoms, stiffness, swelling, muscle weakness and myalgia.  Dermatologic: Denied dermatology symptoms, rash  and scar.  Neurologic: Denied neurology symptoms, dizziness, headache, neck pain and syncope.  Psychiatric: Denied psychiatric symptoms, anxiety and depression.  Endocrine: Denied endocrine symptoms including hot flashes and night sweats.   Meds:   No current outpatient medications on file prior to visit.   No current facility-administered medications on file prior to visit.     Objective:     Vitals:   10/05/17 0928  BP: 133/84  Pulse: 76              Physical examination   Pelvic:   Vulva:  Irritated, excoriated-no ulcerative lesions  Vagina: No lesions or abnormalities noted.  Support: Normal pelvic support.  Urethra No masses tenderness or scarring.  Meatus Normal size without lesions or prolapse.  Cervix: Normal appearance.  No lesions.  Anus: Normal exam.  No lesions.  Perineum: Normal exam.  No lesions.        Bimanual   Uterus: Normal size.  Non-tender.  Mobile.  AV.  Adnexae: No masses.  Non-tender to palpation.  Cul-de-sac: Negative for abnormality.   WET PREP: clue cells: absent, KOH (yeast): positive, odor: absent and trichomoniasis: negative Ph:  < 4.5   Assessment:    G3P3 There are no active problems to display for this patient.    1. Vulvar irritation   2. Monilial vulvovaginitis     Patient has also had unprotected intercourse and desires GC/CT testing   Plan:            1.  Diflucan for monilia  2.  GC/CT performed Orders Orders Placed This Encounter  Procedures  . GC/Chlamydia Probe Amp     Meds ordered this encounter  Medications  .  fluconazole (DIFLUCAN) 150 MG tablet    Sig: Take 1 tablet (150 mg total) by mouth once a week.    Dispense:  2 tablet    Refill:  0      F/U  Return for Annual Physical, We will contact her with any abnormal test results.  Elonda Huskyavid J. Evans, M.D. 10/05/2017 10:55 AM

## 2017-10-05 NOTE — Progress Notes (Signed)
Pt stated that she noticed irritation and discomfort in the vaginal area since Sep 27, 2017. Pt stated that as the time went on she noticed that she had some burning when she urinated.

## 2017-10-08 LAB — GC/CHLAMYDIA PROBE AMP
CHLAMYDIA, DNA PROBE: POSITIVE — AB
Neisseria gonorrhoeae by PCR: NEGATIVE

## 2017-10-14 ENCOUNTER — Telehealth: Payer: Self-pay | Admitting: Obstetrics and Gynecology

## 2017-10-14 NOTE — Telephone Encounter (Signed)
The patient called and stated that she missed a call from Crystal, And would like a call back if possible. Please advise.

## 2017-10-15 ENCOUNTER — Telehealth: Payer: Self-pay | Admitting: Obstetrics and Gynecology

## 2017-10-15 ENCOUNTER — Telehealth: Payer: Self-pay

## 2017-10-15 MED ORDER — AZITHROMYCIN 1 G PO PACK: 1.0000 g | PACK | Freq: Once | ORAL | 0 refills | Status: AC

## 2017-10-15 NOTE — Telephone Encounter (Signed)
The patient returned Crystal's call from yesterday and would like a call back about her test results.  Please advise, thanks.

## 2017-10-15 NOTE — Telephone Encounter (Signed)
See lab result

## 2017-10-15 NOTE — Telephone Encounter (Signed)
-----   Message from Linzie Collinavid James Evans, MD sent at 10/14/2017  9:25 AM EDT -----  Will you please see if she can come in tomorrow to discuss this result. Thank you ----- Message ----- From: Nell RangeInterface, [facility] Lab Results In Sent: 10/08/2017  11:38 AM To: Linzie Collinavid James Evans, MD

## 2017-10-15 NOTE — Telephone Encounter (Signed)
Pt aware. Pt is unable to come in d/t starting a new job this week. DJE ok to erx atb. Pt needs toc appt in 3 weeks. Pt aware no sex for 7 days. Partners need to be made aware.  Encouraged condom use. [facility] Report faxed.

## 2017-10-18 ENCOUNTER — Encounter: Payer: Self-pay | Admitting: Obstetrics and Gynecology

## 2017-11-18 ENCOUNTER — Other Ambulatory Visit (HOSPITAL_COMMUNITY)
Admission: RE | Admit: 2017-11-18 | Discharge: 2017-11-18 | Disposition: A | Discharge status: Home / Self Care | Payer: No Typology Code available for payment source | Source: Ambulatory Visit | Attending: Obstetrics and Gynecology | Admitting: Obstetrics and Gynecology

## 2017-11-18 ENCOUNTER — Encounter: Payer: Self-pay | Admitting: Obstetrics and Gynecology

## 2017-11-18 ENCOUNTER — Ambulatory Visit (INDEPENDENT_AMBULATORY_CARE_PROVIDER_SITE_OTHER): Payer: No Typology Code available for payment source | Admitting: Obstetrics and Gynecology

## 2017-11-18 VITALS — BP 130/87 | HR 81 | Wt 180.1 lb

## 2017-11-18 DIAGNOSIS — A749 Chlamydial infection, unspecified: Secondary | ICD-10-CM | POA: Insufficient documentation

## 2017-11-18 NOTE — Progress Notes (Signed)
Pt stated that she is felling much better and she is having no sypmtoms.

## 2017-11-18 NOTE — Progress Notes (Signed)
HPI:      Bailey [name] is a 42 y.o. G3P3 who LMP was Patient's last menstrual period was 07/25/2010.  Subjective:   She presents today after being found to have a positive chlamydia approximately 1 month ago.  She and her partner were both treated appropriately.  She presents today for test of cure.  She denies any symptoms.    Hx: The following portions of the patient's history were reviewed and updated as appropriate:             She  has a past medical history of Endometriosis and Ovarian cyst. She does not have a problem list on file. She  has a past surgical history that includes Abdominal hysterectomy; Appendectomy; and Cesarean section. Her family history includes Cancer in her father and paternal aunt. She  reports that she has never smoked. She has never used smokeless tobacco. She reports that she does not drink alcohol or use drugs. She currently has no medications in their medication list. She has No Known Allergies.       Review of Systems:  Review of Systems  Constitutional: Denied constitutional symptoms, night sweats, recent illness, fatigue, fever, insomnia and weight loss.  Eyes: Denied eye symptoms, eye pain, photophobia, vision change and visual disturbance.  Ears/Nose/Throat/Neck: Denied ear, nose, throat or neck symptoms, hearing loss, nasal discharge, sinus congestion and sore throat.  Cardiovascular: Denied cardiovascular symptoms, arrhythmia, chest pain/pressure, edema, exercise intolerance, orthopnea and palpitations.  Respiratory: Denied pulmonary symptoms, asthma, pleuritic pain, productive sputum, cough, dyspnea and wheezing.  Gastrointestinal: Denied, gastro-esophageal reflux, melena, nausea and vomiting.  Genitourinary: Denied genitourinary symptoms including symptomatic vaginal discharge, pelvic relaxation issues, and urinary complaints.  Musculoskeletal: Denied musculoskeletal symptoms, stiffness, swelling, muscle weakness and myalgia.  Dermatologic:  Denied dermatology symptoms, rash and scar.  Neurologic: Denied neurology symptoms, dizziness, headache, neck pain and syncope.  Psychiatric: Denied psychiatric symptoms, anxiety and depression.  Endocrine: Denied endocrine symptoms including hot flashes and night sweats.   Meds:   No current outpatient medications on file prior to visit.   No current facility-administered medications on file prior to visit.     Objective:     Vitals:   11/18/17 0820  BP: 130/87  Pulse: 81              Physical examination   Pelvic:   Vulva: Normal appearance.  No lesions.  Vagina: No lesions or abnormalities noted.  Support: Normal pelvic support.  Urethra No masses tenderness or scarring.  Meatus Normal size without lesions or prolapse.  Cervix: Normal appearance.  No lesions.  Anus: Normal exam.  No lesions.  Perineum: Normal exam.  No lesions.    Assessment:    G3P3 There are no active problems to display for this patient.    1. Chlamydia infection     Here for test of cure currently asymptomatic.   Plan:            1.  Test of cure for chlamydia. Orders No orders of the defined types were placed in this encounter.   No orders of the defined types were placed in this encounter.     F/U  Return for We will contact her with any abnormal test results, Annual Physical. I spent 16 minutes involved in the care of this patient of which greater than 50% was spent discussing discussing the transmission of chlamydia, timing of chlamydia, treatment of partners, resumption of intercourse, need for test of cure.  All  questions answered.  Elonda Husky, M.D. 11/18/2017 9:04 AM

## 2017-11-19 LAB — GC/CHLAMYDIA PROBE AMP (~~LOC~~) NOT AT ARMC
Chlamydia: NEGATIVE
Neisseria Gonorrhea: NEGATIVE

## 2018-04-06 ENCOUNTER — Telehealth: Payer: No Typology Code available for payment source | Admitting: Family

## 2018-04-06 DIAGNOSIS — J069 Acute upper respiratory infection, unspecified: Secondary | ICD-10-CM

## 2018-04-06 MED ORDER — FLUTICASONE PROPIONATE 50 MCG/ACT NA SUSP: 2.0000 | Freq: Every day | NASAL | 6 refills | Status: DC

## 2018-04-06 NOTE — Progress Notes (Signed)
We are sorry you are not feeling well.  Here is how we plan to help!  Based on what you have shared with me, it looks like you may have a viral upper respiratory infection or a "common cold".  Colds are caused by a large number of viruses; however, rhinovirus is the most common cause.   Symptoms of the common cold vary from person to person, with common symptoms including sore throat, cough, and malaise.  A low-grade fever of 100.4 may present, but is often uncommon.  Symptoms vary however, and are closely related to a person's age or underlying illnesses.  The most common symptoms associated with the common cold are nasal discharge or congestion, cough, sneezing, headache and pressure in the ears and face.  Cold symptoms usually persist for about 3 to 10 days, but can last up to 2 weeks.  It is important to know that colds do not cause serious illness or complications in most cases.    The common cold is transmitted from person to person, with the most common method of transmission being a person's hands.  The virus is able to live on the skin and can infect other persons for up to 2 hours after direct contact.  Also, colds are transmitted when someone coughs or sneezes; thus, it is important to cover the mouth to reduce this risk.  To keep the spread of the common cold at bay, good hand hygiene is very important.  This is an infection that is most likely caused by a virus. There are no specific treatments for the common cold other than to help you with the symptoms until the infection runs its course.    For nasal congestion, you may use an oral decongestants such as Mucinex D or if you have glaucoma or high blood pressure use plain Mucinex.  Saline nasal spray or nasal drops can help and can safely be used as often as needed for congestion.  For your congestion, I have prescribed Fluticasone nasal spray one spray in each nostril twice a day  If you do not have a history of heart disease, hypertension,  diabetes or thyroid disease, prostate/bladder issues or glaucoma, you may also use Sudafed to treat nasal congestion.  It is highly recommended that you consult with a pharmacist or your primary care physician to ensure this medication is safe for you to take.     If you have a cough, you may use cough suppressants such as Delsym and Robitussin.  If you have glaucoma or high blood pressure, you can also use Coricidin HBP.     If you have a sore or scratchy throat, use a saltwater gargle-  to  teaspoon of salt dissolved in a 4-ounce to 8-ounce glass of warm water.  Gargle the solution for approximately 15-30 seconds and then spit.  It is important not to swallow the solution.  You can also use throat lozenges/cough drops and Chloraseptic spray to help with throat pain or discomfort.  Warm or cold liquids can also be helpful in relieving throat pain.  For headache, pain or general discomfort, you can use Ibuprofen or Tylenol as directed.   Some authorities believe that zinc sprays or the use of Echinacea may shorten the course of your symptoms.   HOME CARE . Only take medications as instructed by your medical team. . Be sure to drink plenty of fluids. Water is fine as well as fruit juices, sodas and electrolyte beverages. You may want to stay   away from caffeine or alcohol. If you are nauseated, try taking small sips of liquids. How do you know if you are getting enough fluid? Your urine should be a pale yellow or almost colorless. . Get rest. . Taking a steamy shower or using a humidifier may help nasal congestion and ease sore throat pain. You can place a towel over your head and breathe in the steam from hot water coming from a faucet. . Using a saline nasal spray works much the same way. . Cough drops, hard candies and sore throat lozenges may ease your cough. . Avoid close contacts especially the very young and the elderly . Cover your mouth if you cough or sneeze . Always remember to wash  your hands.   GET HELP RIGHT AWAY IF: . You develop worsening fever. . If your symptoms do not improve within 10 days . You develop yellow or green discharge from your nose over 3 days. . You have coughing fits . You develop a severe head ache or visual changes. . You develop shortness of breath, difficulty breathing or start having chest pain . Your symptoms persist after you have completed your treatment plan  MAKE SURE YOU   Understand these instructions.  Will watch your condition.  Will get help right away if you are not doing well or get worse.  Your e-visit answers were reviewed by a board certified advanced clinical practitioner to complete your personal care plan. Depending upon the condition, your plan could have included both over the counter or prescription medications. Please review your pharmacy choice. If there is a problem, you may call our nursing hot line at and have the prescription routed to another pharmacy. Your safety is important to us. If you have drug allergies check your prescription carefully.   You can use MyChart to ask questions about today's visit, request a non-urgent call back, or ask for a work or school excuse for 24 hours related to this e-Visit. If it has been greater than 24 hours you will need to follow up with your provider, or enter a new e-Visit to address those concerns. You will get an e-mail in the next two days asking about your experience.  I hope that your e-visit has been valuable and will speed your recovery. Thank you for using e-visits.      

## 2018-12-06 ENCOUNTER — Ambulatory Visit: Payer: No Typology Code available for payment source | Admitting: Primary Care

## 2019-06-03 ENCOUNTER — Encounter: Payer: Self-pay | Admitting: Emergency Medicine

## 2019-06-03 ENCOUNTER — Ambulatory Visit (INDEPENDENT_AMBULATORY_CARE_PROVIDER_SITE_OTHER): Payer: No Typology Code available for payment source

## 2019-06-03 ENCOUNTER — Ambulatory Visit
Admission: EM | Admit: 2019-06-03 | Discharge: 2019-06-03 | Disposition: A | Discharge status: Home / Self Care | Payer: No Typology Code available for payment source | Attending: Family Medicine | Admitting: Family Medicine

## 2019-06-03 ENCOUNTER — Other Ambulatory Visit: Payer: Self-pay

## 2019-06-03 DIAGNOSIS — Z79899 Other long term (current) drug therapy: Secondary | ICD-10-CM | POA: Diagnosis not present

## 2019-06-03 DIAGNOSIS — Z20822 Contact with and (suspected) exposure to covid-19: Secondary | ICD-10-CM | POA: Insufficient documentation

## 2019-06-03 DIAGNOSIS — J189 Pneumonia, unspecified organism: Secondary | ICD-10-CM | POA: Diagnosis not present

## 2019-06-03 DIAGNOSIS — R0602 Shortness of breath: Secondary | ICD-10-CM

## 2019-06-03 DIAGNOSIS — R05 Cough: Secondary | ICD-10-CM

## 2019-06-03 LAB — SARS CORONAVIRUS 2 AG (30 MIN TAT): SARS Coronavirus 2 Ag: NEGATIVE

## 2019-06-03 MED ORDER — HYDROCOD POLST-CPM POLST ER 10-8 MG/5ML PO SUER: 5.0000 mL | Freq: Two times a day (BID) | ORAL | 0 refills | Status: DC | PRN

## 2019-06-03 MED ORDER — AZITHROMYCIN 250 MG PO TABS: ORAL_TABLET | ORAL | 0 refills | Status: DC

## 2019-06-03 NOTE — ED Triage Notes (Signed)
Patient c/o left ear pain and pressure that started a week ago.  Patient c/o cough for about 2 weeks.  Patient states that last night she felt SOB walking in her kitchen.  Patient denies fevers. Patient states that she had her 1st dose of COVID vaccine 2 weeks ago.

## 2019-06-03 NOTE — ED Provider Notes (Signed)
[facility]    CSN: [id number] Arrival date & time: 06/03/19  1019      History   Chief Complaint Chief Complaint  Patient presents with  . Cough  . Shortness of Breath  . Otalgia    HPI Bailey [name] is a 44 y.o. female.   44 yo female with a c/o cough for about 2 weeks with sudden shortness of breath for the last 2 days. Denies any fevers, chills, chest pains, wheezing. States feels short of breath walking very short distances. States she received her 1st covid vaccine 2 weeks ago.      Past Medical History:  Diagnosis Date  . Endometriosis   . Ovarian cyst     There are no problems to display for this patient.   Past Surgical History:  Procedure Laterality Date  . ABDOMINAL HYSTERECTOMY    . APPENDECTOMY    . CESAREAN SECTION      OB History    Gravida  3   Para  3   Term      Preterm      AB      Living        SAB      TAB      Ectopic      Multiple      Live Births               Home Medications    Prior to Admission medications   Medication Sig Start Date End Date Taking? Authorizing Provider  azithromycin (ZITHROMAX Z-PAK) 250 MG tablet 2 tabs po once day 1, then 1 tab po qd for the next 4 days 06/03/19   Norval Gable, MD  chlorpheniramine-HYDROcodone [facility] PENNKINETIC ER) 10-8 MG/5ML SUER Take 5 mLs by mouth every 12 (twelve) hours as needed. 06/03/19   Norval Gable, MD  fluticasone (FLONASE) 50 MCG/ACT nasal spray Place 2 sprays into both nostrils daily. 04/06/18   Sharion Balloon, FNP    Family History Family History  Problem Relation Age of Onset  . Cancer Father        prostate  . Cancer Paternal Aunt        breast    Social History Social History   Tobacco Use  . Smoking status: Never Smoker  . Smokeless tobacco: Never Used  Substance Use Topics  . Alcohol use: No  . Drug use: No     Allergies   Patient has no known allergies.   Review of Systems Review of Systems   Physical  Exam Triage Vital Signs ED Triage Vitals  Enc Vitals Group     BP 06/03/19 1036 (!) 136/102     Pulse Rate 06/03/19 1036 98     Resp 06/03/19 1036 14     Temp 06/03/19 1036 98.1 F (36.7 C)     Temp Source 06/03/19 1036 Oral     SpO2 06/03/19 1036 95 %     Weight 06/03/19 1033 183 lb (83 kg)     Height 06/03/19 1033 5' (1.524 m)     Head Circumference --      Peak Flow --      Pain Score 06/03/19 1033 2     Pain Loc --      Pain Edu? --      Excl. in [place]? --    No data found.  Updated Vital Signs BP (!) 136/102 (BP Location: Right Arm)   Pulse (!) 112 Comment: Ambulating  Temp 98.1 F (36.7 C) (Oral)   Resp 14   Ht 5' (1.524 m)   Wt 83 kg   LMP 07/25/2010   SpO2 92% Comment: Ambulating  BMI 35.74 kg/m   Visual Acuity Right Eye Distance:   Left Eye Distance:   Bilateral Distance:    Right Eye Near:   Left Eye Near:    Bilateral Near:     Physical Exam Vitals and nursing note reviewed.  Constitutional:      General: She is not in acute distress.    Appearance: She is not toxic-appearing or diaphoretic.  Cardiovascular:     Rate and Rhythm: Tachycardia present.  Pulmonary:     Effort: Pulmonary effort is normal. No respiratory distress.  Neurological:     Mental Status: She is alert.      UC Treatments / Results  Labs (all labs ordered are listed, but only abnormal results are displayed) Labs Reviewed  SARS CORONAVIRUS 2 AG (30 MIN TAT)  NOVEL CORONAVIRUS, NAA (HOSP ORDER, SEND-OUT TO REF LAB; TAT 18-24 HRS)    EKG   Radiology DG Chest 2 View  Result Date: 06/03/2019 CLINICAL DATA:  Cough and shortness of breath for 2 weeks. EXAM: CHEST - 2 VIEW COMPARISON:  None. FINDINGS: The heart size and mediastinal contours are within normal limits. Patchy areas of opacity are seen in the peripheral lower lung zones bilaterally. No evidence of pleural effusion. The visualized skeletal structures are unremarkable. IMPRESSION: Patchy peripheral bilateral lower  lung opacities, suspicious for viral or atypical pneumonia. Recommend chest radiographic followup to confirm resolution. Electronically Signed   By: Danae Orleans M.D.   On: 06/03/2019 11:17    Procedures Procedures (including critical care time)  Medications Ordered in UC Medications - No data to display  Initial Impression / Assessment and Plan / UC Course  I have reviewed the triage vital signs and the nursing notes.  Pertinent labs & imaging results that were available during my care of the patient were reviewed by me and considered in my medical decision making (see chart for details).      Final Clinical Impressions(s) / UC Diagnoses   Final diagnoses:  Atypical pneumonia     Discharge Instructions     Rest, fluids, over the counter medications as needed Await covid test    ED Prescriptions    Medication Sig Dispense Auth. Provider   azithromycin (ZITHROMAX Z-PAK) 250 MG tablet 2 tabs po once day 1, then 1 tab po qd for the next 4 days 6 each Payton Mccallum, MD   chlorpheniramine-HYDROcodone (TUSSIONEX PENNKINETIC ER) 10-8 MG/5ML SUER Take 5 mLs by mouth every 12 (twelve) hours as needed. 60 mL Payton Mccallum, MD      1. Labs/x-ray results and diagnosis reviewed with patient 2. rx as per orders above; reviewed possible side effects, interactions, risks and benefits  3. Recommend supportive treatment as above 4. Follow-up prn if symptoms worsen or don't improve  I have reviewed the PDMP during this encounter.   Payton Mccallum, MD 06/03/19 1655

## 2019-06-03 NOTE — Discharge Instructions (Addendum)
Rest, fluids, over the counter medications as needed °Await covid test °

## 2019-06-04 LAB — NOVEL CORONAVIRUS, NAA (HOSP ORDER, SEND-OUT TO REF LAB; TAT 18-24 HRS): SARS-CoV-2, NAA: NOT DETECTED

## 2021-01-28 ENCOUNTER — Other Ambulatory Visit: Payer: Self-pay | Admitting: Family Medicine

## 2021-01-28 DIAGNOSIS — Z1231 Encounter for screening mammogram for malignant neoplasm of breast: Secondary | ICD-10-CM

## 2021-02-11 ENCOUNTER — Other Ambulatory Visit: Payer: Self-pay

## 2021-02-11 ENCOUNTER — Ambulatory Visit
Admission: RE | Admit: 2021-02-11 | Discharge: 2021-02-11 | Disposition: A | Discharge status: Home / Self Care | Payer: Managed Care, Other (non HMO) | Source: Ambulatory Visit | Attending: Family Medicine | Admitting: Family Medicine

## 2021-02-11 DIAGNOSIS — Z1231 Encounter for screening mammogram for malignant neoplasm of breast: Secondary | ICD-10-CM | POA: Diagnosis not present

## 2021-02-18 ENCOUNTER — Ambulatory Visit
Admission: RE | Admit: 2021-02-18 | Discharge: 2021-02-18 | Disposition: A | Discharge status: Home / Self Care | Payer: Managed Care, Other (non HMO) | Source: Ambulatory Visit | Attending: Family Medicine | Admitting: Family Medicine

## 2021-02-18 ENCOUNTER — Other Ambulatory Visit: Payer: Self-pay | Admitting: Family Medicine

## 2021-02-18 ENCOUNTER — Other Ambulatory Visit: Payer: Self-pay

## 2021-02-18 DIAGNOSIS — R928 Other abnormal and inconclusive findings on diagnostic imaging of breast: Secondary | ICD-10-CM | POA: Insufficient documentation

## 2021-02-18 DIAGNOSIS — N6320 Unspecified lump in the left breast, unspecified quadrant: Secondary | ICD-10-CM | POA: Insufficient documentation

## 2021-11-19 ENCOUNTER — Other Ambulatory Visit: Payer: Self-pay | Admitting: Family Medicine

## 2021-11-19 DIAGNOSIS — N6320 Unspecified lump in the left breast, unspecified quadrant: Secondary | ICD-10-CM

## 2021-12-05 ENCOUNTER — Ambulatory Visit
Admission: RE | Admit: 2021-12-05 | Discharge: 2021-12-05 | Disposition: A | Discharge status: Home / Self Care | Payer: Managed Care, Other (non HMO) | Source: Ambulatory Visit | Attending: Family Medicine | Admitting: Family Medicine

## 2021-12-05 DIAGNOSIS — N6320 Unspecified lump in the left breast, unspecified quadrant: Secondary | ICD-10-CM | POA: Insufficient documentation

## 2022-09-12 ENCOUNTER — Ambulatory Visit
Admission: EM | Admit: 2022-09-12 | Discharge: 2022-09-12 | Disposition: A | Discharge status: Home / Self Care | Payer: Managed Care, Other (non HMO)

## 2022-09-12 DIAGNOSIS — R6884 Jaw pain: Secondary | ICD-10-CM | POA: Diagnosis not present

## 2022-09-12 DIAGNOSIS — K1120 Sialoadenitis, unspecified: Secondary | ICD-10-CM

## 2022-09-12 MED ORDER — PREDNISONE 20 MG PO TABS: 40.0000 mg | ORAL_TABLET | Freq: Every day | ORAL | 0 refills | Status: AC

## 2022-09-12 MED ORDER — AMOXICILLIN-POT CLAVULANATE 875-125 MG PO TABS: 1.0000 | ORAL_TABLET | Freq: Two times a day (BID) | ORAL | 0 refills | Status: DC

## 2022-09-12 NOTE — ED Provider Notes (Signed)
[facility]    CSN: [id number] Arrival date & time: 09/12/22  1000      History   Chief Complaint Chief Complaint  Patient presents with   Ear Pain    HPI PINA KELM is a 47 y.o. female.   HPI  Presents to urgent care with complaint of left neck and jaw pain radiating to her left ear.  Symptoms starting 3 days ago. Denies URI symptoms. No pain when eating or biting down.  Past Medical History:  Diagnosis Date   Endometriosis    Ovarian cyst     There are no problems to display for this patient.   Past Surgical History:  Procedure Laterality Date   ABDOMINAL HYSTERECTOMY     APPENDECTOMY     CESAREAN SECTION      OB History     Gravida  3   Para  3   Term      Preterm      AB      Living         SAB      IAB      Ectopic      Multiple      Live Births               Home Medications    Prior to Admission medications   Medication Sig Start Date End Date Taking? Authorizing Provider  azithromycin (ZITHROMAX Z-PAK) 250 MG tablet 2 tabs po once day 1, then 1 tab po qd for the next 4 days 06/03/19   Payton Mccallum, MD  buPROPion (WELLBUTRIN XL) 150 MG 24 hr tablet Take 150 mg by mouth every morning.    [provider]  chlorpheniramine-HYDROcodone (TUSSIONEX PENNKINETIC ER) 10-8 MG/5ML SUER Take 5 mLs by mouth every 12 (twelve) hours as needed. 06/03/19   Payton Mccallum, MD  fluticasone (FLONASE) 50 MCG/ACT nasal spray Place 2 sprays into both nostrils daily. 04/06/18   Jannifer Rodney A, FNP  sertraline (ZOLOFT) 50 MG tablet Take 50 mg by mouth daily.    [provider]  SUMAtriptan (IMITREX) 25 MG tablet Take by mouth.    [provider]    Family History Family History  Problem Relation Age of Onset   Cancer Father        prostate   Breast cancer Paternal Aunt 40   Cancer Paternal Aunt        breast    Social History Social History   Tobacco Use   Smoking status: Never   Smokeless  tobacco: Never  Vaping Use   Vaping Use: Never used  Substance Use Topics   Alcohol use: No   Drug use: No     Allergies   Patient has no known allergies.   Review of Systems Review of Systems   Physical Exam Triage Vital Signs ED Triage Vitals  Enc Vitals Group     BP 09/12/22 1033 134/81     Pulse Rate 09/12/22 1033 91     Resp 09/12/22 1033 18     Temp 09/12/22 1033 98.3 F (36.8 C)     Temp Source 09/12/22 1033 Oral     SpO2 09/12/22 1033 95 %     Weight --      Height --      Head Circumference --      Peak Flow --      Pain Score 09/12/22 1034 3     Pain Loc --  Pain Edu? --      Excl. in [place]? --    No data found.  Updated Vital Signs BP 134/81 (BP Location: Left Arm)   Pulse 91   Temp 98.3 F (36.8 C) (Oral)   Resp 18   LMP 07/25/2010   SpO2 95%   Visual Acuity Right Eye Distance:   Left Eye Distance:   Bilateral Distance:    Right Eye Near:   Left Eye Near:    Bilateral Near:     Physical Exam Vitals reviewed.  Constitutional:      Appearance: Normal appearance.  HENT:     Right Ear: Tympanic membrane and ear canal normal.     Left Ear: Tympanic membrane and ear canal normal.     Mouth/Throat:     Mouth: Mucous membranes are moist. No oral lesions.     Dentition: Normal dentition. No dental tenderness, gingival swelling, dental caries, dental abscesses or gum lesions.     Tongue: No lesions.     Pharynx: Oropharynx is clear.     Comments: No tenderness when biting down. Lymphadenopathy:     Cervical: Cervical adenopathy present.  Skin:    General: Skin is warm and dry.  Neurological:     General: No focal deficit present.     Mental Status: She is alert and oriented to person, place, and time.  Psychiatric:        Mood and Affect: Mood normal.        Behavior: Behavior normal.      UC Treatments / Results  Labs (all labs ordered are listed, but only abnormal results are displayed) Labs Reviewed - No data to  display  EKG   Radiology No results found.  Procedures Procedures (including critical care time)  Medications Ordered in UC Medications - No data to display  Initial Impression / Assessment and Plan / UC Course  I have reviewed the triage vital signs and the nursing notes.  Pertinent labs & imaging results that were available during my care of the patient were reviewed by me and considered in my medical decision making (see chart for details).   ELYSHA MUHA is a 47 y.o. female presenting with jaw pain radiating to L ear. Patient is afebrile without recent antipyretics, satting well on room air. Overall is well appearing and non-toxic, well hydrated, without respiratory distress.  TMs are WNL bilaterally.  No oral edema or lesions present.  No erythema.  There is left-sided lymphadenopathy.  Considering neuralgia versus infection (salivary). Will treat with Augmentin to cover bacterial infection. Also prescribing prednisone to relieve inflammation.  Reviewed chart history. Additional history obtained from patient family/caregiver present during the exam.  Counseled patient on potential for adverse effects with medications prescribed/recommended today, ER and return-to-clinic precautions discussed, patient verbalized understanding and agreement with care plan.  Final Clinical Impressions(s) / UC Diagnoses   Final diagnoses:  None   Discharge Instructions   None    ED Prescriptions   None    PDMP not reviewed this encounter.   Charma Igo, FNP 09/12/22 1120

## 2022-09-12 NOTE — ED Triage Notes (Addendum)
Pt reports having pain to left neck/jaw that radiates up to left ear.  Started: 3 day ago   Home interventions: advil

## 2022-09-12 NOTE — Discharge Instructions (Signed)
Follow up here or with your primary care provider if your symptoms are worsening or not improving.     

## 2023-01-22 ENCOUNTER — Other Ambulatory Visit: Payer: Self-pay | Admitting: Family Medicine

## 2023-01-22 DIAGNOSIS — N63 Unspecified lump in unspecified breast: Secondary | ICD-10-CM

## 2023-03-04 ENCOUNTER — Ambulatory Visit
Admission: RE | Admit: 2023-03-04 | Discharge: 2023-03-04 | Disposition: A | Discharge status: Home / Self Care | Payer: Managed Care, Other (non HMO) | Source: Ambulatory Visit | Attending: Family Medicine | Admitting: Family Medicine

## 2023-03-04 DIAGNOSIS — N63 Unspecified lump in unspecified breast: Secondary | ICD-10-CM | POA: Insufficient documentation

## 2024-02-09 ENCOUNTER — Other Ambulatory Visit: Payer: Self-pay | Admitting: Nurse Practitioner

## 2024-02-09 ENCOUNTER — Encounter: Payer: Self-pay | Admitting: Plastic Surgery

## 2024-02-09 ENCOUNTER — Ambulatory Visit: Admitting: Plastic Surgery

## 2024-02-09 VITALS — BP 119/87 | HR 97 | Ht 60.0 in | Wt 179.0 lb

## 2024-02-09 DIAGNOSIS — Z1231 Encounter for screening mammogram for malignant neoplasm of breast: Secondary | ICD-10-CM

## 2024-02-09 DIAGNOSIS — M542 Cervicalgia: Secondary | ICD-10-CM

## 2024-02-09 DIAGNOSIS — N62 Hypertrophy of breast: Secondary | ICD-10-CM | POA: Diagnosis not present

## 2024-02-09 DIAGNOSIS — M546 Pain in thoracic spine: Secondary | ICD-10-CM

## 2024-02-09 DIAGNOSIS — N6489 Other specified disorders of breast: Secondary | ICD-10-CM | POA: Diagnosis not present

## 2024-02-09 NOTE — Progress Notes (Signed)
 Referring Provider No referring provider defined for this encounter.   CC:  Chief Complaint  Patient presents with   consult      NEHAL WITTING is an 48 y.o. female.  HPI: Ms. Vetere is a 48 year old female who presents today with longstanding complaints of upper back and neck pain secondary to large breast size.  Patient also notes that is difficult to find close that fit appropriately and she also has pain in her breast when she exercises due to the large size of her breast.  She states that she had large breast from the time that she began breast development as a teenager.  She has had difficulty with back pain most of her life.  She is interested in surgical reduction in the size of her breast.  Of note she does have a benign finding on mammogram which has been stable for 2 years.  I have asked her to make sure she has her yearly mammogram which is due this month prior to scheduling surgery as it would be approximately 6 months before she can proceed with her screening mammograms.  She does not smoke, has no significant past medical history, does not use blood thinners and does not have any history of DVT.  No Known Allergies  Outpatient Encounter Medications as of 02/09/2024  Medication Sig   SUMAtriptan (IMITREX) 50 MG tablet Take 50 mg by mouth.   sertraline (ZOLOFT) 50 MG tablet Take 50 mg by mouth daily.   SUMAtriptan (IMITREX) 25 MG tablet Take by mouth.   [DISCONTINUED] amoxicillin -clavulanate (AUGMENTIN ) 875-125 MG tablet Take 1 tablet by mouth every 12 (twelve) hours.   [DISCONTINUED] azithromycin  (ZITHROMAX  Z-PAK) 250 MG tablet 2 tabs po once day 1, then 1 tab po qd for the next 4 days   [DISCONTINUED] buPROPion (WELLBUTRIN XL) 150 MG 24 hr tablet Take 150 mg by mouth every morning.   [DISCONTINUED] chlorpheniramine-HYDROcodone (TUSSIONEX PENNKINETIC ER) 10-8 MG/5ML SUER Take 5 mLs by mouth every 12 (twelve) hours as needed.   [DISCONTINUED] fluticasone  (FLONASE ) 50 MCG/ACT  nasal spray Place 2 sprays into both nostrils daily.   No facility-administered encounter medications on file as of 02/09/2024.     Past Medical History:  Diagnosis Date   Endometriosis    Ovarian cyst     Past Surgical History:  Procedure Laterality Date   ABDOMINAL HYSTERECTOMY     APPENDECTOMY     CESAREAN SECTION      Family History  Problem Relation Age of Onset   Cancer Father        prostate   Breast cancer Paternal Aunt 45   Cancer Paternal Aunt        breast    Social History   Social History Narrative   Not on file     Review of Systems General: Denies fevers, chills, weight loss CV: Denies chest pain, shortness of breath, palpitations Breast: Large breasts which she feels are contributing to her upper back and neck pain.  Benign finding which has been stable for at least 2 years on mammogram.  Physical Exam    02/09/2024    9:42 AM 09/12/2022   10:33 AM 06/03/2019   11:03 AM  Vitals with BMI  Height 5' 0    Weight 179 lbs    BMI 34.96    Systolic 119 134   Diastolic 87 81   Pulse 97 91 112    General:  No acute distress,  Alert and oriented, Non-Toxic, Normal  speech and affect Breast: Patient has very large breasts with some asymmetry, left greater than right.  She has grade 3 ptosis.  Her sternal notch to nipple distance on the right is 37 cm and 38 cm on the left the nipple to fold distance on the right is 15 cm and 15 cm on the left. Mammogram: October 2024 stable benign findings, BI-RADS 2 Assessment/Plan Symptomatic macromastia: Patient has large breasts and I believe she would benefit from a bilateral breast reduction.  I believe that I can remove 500 g on the right and 600 g on the left.  I showed her the location of the incisions and we discussed the unpredictable nature of scarring and wound healing.  We discussed risk of bleeding, infection, and seroma formation.  She understands I will use drains postoperatively.  She will need to wear a  supportive compressive garment postoperatively for 6 weeks.  We discussed the risks of nipple loss due to nipple ischemia.  We discussed the postoperative limitations including no heavy lifting greater than 20 pounds, no vigorous activity, no submerging the incisions in water for 6 weeks.  She will be encouraged to begin ambulation immediately after surgery to help decrease the risk of DVT.  All questions were answered to her satisfaction.  Photographs were obtained today with her consent.  Will submit her for a bilateral breast reduction at her request.  Leonce KATHEE [place] 02/09/2024, 10:16 AM

## 2024-02-23 ENCOUNTER — Encounter: Payer: Self-pay | Admitting: Plastic Surgery

## 2024-02-29 ENCOUNTER — Other Ambulatory Visit: Payer: Self-pay | Admitting: Medical Genetics

## 2024-03-07 ENCOUNTER — Ambulatory Visit
Admission: RE | Admit: 2024-03-07 | Discharge: 2024-03-07 | Disposition: A | Discharge status: Home / Self Care | Source: Ambulatory Visit | Attending: Nurse Practitioner | Admitting: Nurse Practitioner

## 2024-03-07 DIAGNOSIS — Z1231 Encounter for screening mammogram for malignant neoplasm of breast: Secondary | ICD-10-CM | POA: Insufficient documentation

## 2024-03-11 ENCOUNTER — Other Ambulatory Visit
Admission: RE | Admit: 2024-03-11 | Discharge: 2024-03-11 | Disposition: A | Payer: Self-pay | Source: Ambulatory Visit | Attending: Medical Genetics | Admitting: Medical Genetics

## 2024-03-21 LAB — GENECONNECT MOLECULAR SCREEN: Genetic Analysis Overall Interpretation: NEGATIVE

## 2024-05-11 ENCOUNTER — Other Ambulatory Visit: Payer: Self-pay

## 2024-05-11 ENCOUNTER — Ambulatory Visit

## 2024-05-11 DIAGNOSIS — L639 Alopecia areata, unspecified: Secondary | ICD-10-CM | POA: Diagnosis not present

## 2024-05-11 MED ORDER — TRIAMCINOLONE ACETONIDE 10 MG/ML IJ SUSP
5.0000 mg | Freq: Once | INTRAMUSCULAR | Status: AC
  Administered 2024-05-11: 5 mg via INTRADERMAL

## 2024-05-11 MED ORDER — PIMECROLIMUS 1 % EX CREA: TOPICAL_CREAM | CUTANEOUS | 1 refills | Status: AC

## 2024-05-11 MED ORDER — PIMECROLIMUS 1 % EX CREA: TOPICAL_CREAM | CUTANEOUS | 1 refills | Status: DC

## 2024-05-11 MED ORDER — CLOBETASOL PROPIONATE 0.05 % EX SOLN: CUTANEOUS | 1 refills | Status: AC

## 2024-05-11 MED ORDER — OPZELURA 1.5 % EX CREA: TOPICAL_CREAM | CUTANEOUS | 1 refills | Status: DC

## 2024-05-11 NOTE — Patient Instructions (Signed)

## 2024-05-11 NOTE — Progress Notes (Signed)
 "   Subjective   Bailey [name] is a 49 y.o. female who presents for the following: alopecia areata. Patient is new patient  Today patient reports: Patches of hair loss of the scalp, not itchy or irritated, was prescribed topical steroid TMC 0.1% cream by PCP, but hasn't noticed an improvement in condition.  Review of Systems:    No other skin or systemic complaints except as noted in HPI or Assessment and Plan.  The following portions of the chart were reviewed this encounter and updated as appropriate: medications, allergies, medical history  Relevant Medical History:  Personal hx of eczema since childhood,Fhx of autoimmune condition in sister - RA, psoriasis, PsA, hepatitis  Objective  (SKPE) Well appearing patient in no apparent distress; mood and affect are within normal limits. Examination was performed of the: Focused Exam of: the scalp   Examination notable for: occipital scalp 5.0 cm area of well demarcated alopecia   Examination limited by: n/a     Assessment & Plan  (SKAP)     Alopecia areata - 5cm patch occipital scalp  Chronic and persistent condition with duration or expected duration over one year. Condition is symptomatic and bothersome to patient. Patient is flaring and not currently at treatment goal.  - Discussed the nature of the disease which involves the immune system attacking the cells of the hair follicle. Explained the chronic, relapsing-remitting nature of the disease and that there is no cure. Discussed increased likelihood of other autoimmune diseases. - Reviewed medication list - no medications suspicious for medication-induced alopecia - Discussed various treatment options including watchful waiting, intralesional steroid injections, topical therapy, and systemics - Discussed various treatment options including watchful waiting, intralesional steroid injections, topical therapy, and systemics  Plan:  - Recent CBC wnl - will check TSH  - Start  clobetasol  0.05 % solution daily to affected areas Discussed side effects of topical steroids including atrophy, dyspigmentation, striae, and telangiectasias. Discussed using for a maximum of 4 weeks, then transitioning to a moisturizing cream/ointment as the rash and pruritus resolve. Advised not to apply to face, armpits, or groin. - intralesional kenalog  as per below - Education: atrophy, dyspigmentation, telangiectasias, striae. Low risk of systemic absorption.  - Start Opzelura  cream to aa every day for maintenance   - Can consider methotrexate, prednisone , po minoxidil, other systemics    Intralesional Kenalog  Procedure Note: After the patient was informed of risks (including atrophy and dyspigmentation), benefits and side effects of intralesional steroid injection, the patient elected to undergo injection and verbal consent was obtained. Skin was cleaned with alcohol and injected  into the sites (below). The patient tolerated the procedure well without complications and was instructed on post-procedure care. Location(s): occipital scalp Number of sites treated: 11 Kenalog  (triamcinolone ) Concentration: 5mg /ml  Volume: 3.0 ml total   NDC 9996-9505-79 Exp date 12/2026 Lot # [id number]  Was sun protection counseling provided?: No   Level of service outlined above   Patient instructions (SKPI)   Procedures, orders, diagnosis for this visit:  ALOPECIA AREATA   This Visit - TSH - triamcinolone  acetonide (KENALOG ) 10 MG/ML injection 5 mg  Alopecia areata -     Triamcinolone  Acetonide -     TSH  Other orders -     Clobetasol  Propionate; Apply 1 mL to affected areas of skin twice daily. Avoid applying to face, groin, and axilla. Use as directed. Long-term use can cause thinning of the skin.  Dispense: 50 mL; Refill: 1 -  Opzelura ; Apply to aa's alopecia areata once daily.  Dispense: 60 g; Refill: 1    Return to clinic: Return for alopecia areata follow up in 6 weeks.  Bailey [name], CMA, am acting as scribe for Bailey [name], MD .   Documentation: I have reviewed the above documentation for accuracy and completeness, and I agree with the above.  Bailey [name], MD  "

## 2024-05-22 ENCOUNTER — Telehealth: Payer: Self-pay

## 2024-05-22 NOTE — Telephone Encounter (Signed)
 Opzelura  denied due to not being used for vitiligo or atopic dermatitis. Please advise.

## 2024-06-22 ENCOUNTER — Ambulatory Visit
